# Patient Record
Sex: Male | Born: 1995 | Race: White | Hispanic: No | Marital: Single | Smoking: Current every day smoker
Health system: Southern US, Community
[De-identification: ages and names within clinical notes are randomized; demographics above are authoritative.]

## PROBLEM LIST (undated history)

## (undated) DIAGNOSIS — F909 Attention-deficit hyperactivity disorder, unspecified type: Secondary | ICD-10-CM

## (undated) HISTORY — PX: TONSILLECTOMY: SUR1361

---

## 2018-09-12 ENCOUNTER — Emergency Department (HOSPITAL_BASED_OUTPATIENT_CLINIC_OR_DEPARTMENT_OTHER)
Admission: EM | Admit: 2018-09-12 | Discharge: 2018-09-12 | Disposition: A | Discharge status: Home / Self Care | Payer: BLUE CROSS/BLUE SHIELD | Attending: Emergency Medicine | Admitting: Emergency Medicine

## 2018-09-12 ENCOUNTER — Emergency Department (HOSPITAL_BASED_OUTPATIENT_CLINIC_OR_DEPARTMENT_OTHER): Payer: BLUE CROSS/BLUE SHIELD

## 2018-09-12 ENCOUNTER — Encounter (HOSPITAL_BASED_OUTPATIENT_CLINIC_OR_DEPARTMENT_OTHER): Payer: Self-pay | Admitting: Emergency Medicine

## 2018-09-12 ENCOUNTER — Other Ambulatory Visit: Payer: Self-pay

## 2018-09-12 DIAGNOSIS — Y999 Unspecified external cause status: Secondary | ICD-10-CM | POA: Insufficient documentation

## 2018-09-12 DIAGNOSIS — S62346A Nondisplaced fracture of base of fifth metacarpal bone, right hand, initial encounter for closed fracture: Secondary | ICD-10-CM | POA: Diagnosis not present

## 2018-09-12 DIAGNOSIS — X58XXXA Exposure to other specified factors, initial encounter: Secondary | ICD-10-CM | POA: Diagnosis not present

## 2018-09-12 DIAGNOSIS — Y929 Unspecified place or not applicable: Secondary | ICD-10-CM | POA: Diagnosis not present

## 2018-09-12 DIAGNOSIS — F909 Attention-deficit hyperactivity disorder, unspecified type: Secondary | ICD-10-CM | POA: Insufficient documentation

## 2018-09-12 DIAGNOSIS — S62339A Displaced fracture of neck of unspecified metacarpal bone, initial encounter for closed fracture: Secondary | ICD-10-CM

## 2018-09-12 DIAGNOSIS — Y9389 Activity, other specified: Secondary | ICD-10-CM | POA: Insufficient documentation

## 2018-09-12 DIAGNOSIS — S6991XA Unspecified injury of right wrist, hand and finger(s), initial encounter: Secondary | ICD-10-CM | POA: Diagnosis present

## 2018-09-12 DIAGNOSIS — F17210 Nicotine dependence, cigarettes, uncomplicated: Secondary | ICD-10-CM | POA: Insufficient documentation

## 2018-09-12 HISTORY — DX: Attention-deficit hyperactivity disorder, unspecified type: F90.9

## 2018-09-12 NOTE — ED Triage Notes (Signed)
Slipped on wet steps last night, falling and injuring right hand.  Swelling to ulnar aspect of hand.

## 2018-09-12 NOTE — ED Provider Notes (Signed)
[facility] EMERGENCY DEPARTMENT Provider Note   CSN: [id number] Arrival date & time: 09/12/18  1005     History   Chief Complaint Chief Complaint  Patient presents with  . Hand Injury    HPI Brent Graham is a 22 y.o. male.  HPI 22 year old male with no pertinent past medical history presents to the ED for evaluation right hand pain.  Patient states that he was trying to jump up to sets of stairs when he fell on wet steps and punched the step with his right hand.  Patient reports pain over his fifth metacarpal.  Patient denies any head injury or LOC.  Denies any weakness.  Reports some intermittent tingling.  Reports pain is worse with palpation or range of motion.  He has not taken anything for the pain prior to arrival.  Denies any other associated injuries from the accident. Past Medical History:  Diagnosis Date  . ADHD     There are no active problems to display for this patient.   Past Surgical History:  Procedure Laterality Date  . TONSILLECTOMY          Home Medications    Prior to Admission medications   Not on File    Family History No family history on file.  Social History Social History   Tobacco Use  . Smoking status: Current Every Day Smoker    Packs/day: 1.00    Types: Cigarettes  . Smokeless tobacco: Former Engineer, water Use Topics  . Alcohol use: Yes    Comment: 2-3 x/week.  Beer  . Drug use: Never     Allergies   Patient has no known allergies.   Review of Systems Review of Systems  Constitutional: Negative for fever.  Musculoskeletal: Positive for arthralgias, joint swelling and myalgias.  Skin: Negative for color change.  Neurological: Negative for weakness, numbness and headaches.  Psychiatric/Behavioral: Negative for sleep disturbance.     Physical Exam Updated Vital Signs BP 106/73 (BP Location: Left Arm)   Pulse 66   Temp 98.3 F (36.8 C) (Oral)   Resp 20   Ht 5\' 9"  (1.753 m)   Wt 93 kg   SpO2  99%   BMI 30.27 kg/m   Physical Exam  Constitutional: He appears well-developed and well-nourished. No distress.  HENT:  Head: Normocephalic and atraumatic.  Eyes: Right eye exhibits no discharge. Left eye exhibits no discharge. No scleral icterus.  Neck: Normal range of motion.  Pulmonary/Chest: No respiratory distress.  Musculoskeletal: Normal range of motion.  Patient does have swelling and pain over the right fifth metacarpal.  There is no obvious deformity noted on my exam.  He does have good flexion of the right fifth metacarpal but limited extension.  Patient has good capillary refill with normal sensation in all digits.  Radial pulses are 2+ bilaterally.  Skin compartments are soft.  He has no scaphoid tenderness.  There is no open wound.  Full range of motion of the right wrist right elbow without pain.  Good opposition of the thumb.   Neurological: He is alert.  Skin: Skin is warm and dry. Capillary refill takes less than 2 seconds. No pallor.  Psychiatric: His behavior is normal. Judgment and thought content normal.  Nursing note and vitals reviewed.    ED Treatments / Results  Labs (all labs ordered are listed, but only abnormal results are displayed) Labs Reviewed - No data to display  EKG None  Radiology Dg Hand Complete Right  Result Date: 09/12/2018 CLINICAL DATA:  Pt c/o right pinky finger pain, bruising, swelling and tenderness since falling on it last night EXAM: RIGHT HAND - COMPLETE 3+ VIEW COMPARISON:  None. FINDINGS: Boxer's fracture of the distal fifth metacarpal, distracted less than 2 mm, with minimal palmar angulation of the distal fracture fragment. No displacement. No definite intra-articular involvement. No other bone abnormality identified. Regional soft tissues unremarkable. IMPRESSION: 1. Minimally displaced boxer's fracture, distal fifth metacarpal. Electronically Signed   By: Corlis Leak M.D.   On: 09/12/2018 10:48    Procedures Procedures  (including critical care time)  Medications Ordered in ED Medications - No data to display   Initial Impression / Assessment and Plan / ED Course  I have reviewed the triage vital signs and the nursing notes.  Pertinent labs & imaging results that were available during my care of the patient were reviewed by me and considered in my medical decision making (see chart for details).     Patient presents the ED for evaluation of right hand pain.  X-rays do show a boxer's fracture.  There is minimal angulation displacement.  No open fracture noted.  Neurovascularly intact.  Ulnar gutter splint was placed.  We will give outpatient Ortho follow-up.  Discussed reasons to return the ED immediately.  Pt is hemodynamically stable, in NAD, & able to ambulate in the ED. Evaluation does not show pathology that would require ongoing emergent intervention or inpatient treatment. I explained the diagnosis to the patient. Pain has been managed & has no complaints prior to dc. Pt is comfortable with above plan and is stable for discharge at this time. All questions were answered prior to disposition. Strict return precautions for f/u to the ED were discussed. Encouraged follow up with PCP.    Final Clinical Impressions(s) / ED Diagnoses   Final diagnoses:  Closed boxer's fracture, initial encounter    ED Discharge Orders    None       Wallace Keller 09/12/18 1148    Little, Ambrose Finland, MD 09/13/18 [phone]

## 2018-09-12 NOTE — Discharge Instructions (Addendum)
Motrin and tylenonl as needed for pain. Ice affected area (see instructions below).  Please call the orthopedic physician listed today or first thing in the morning to schedule a follow up appointment.   Fractures generally take 4-6 weeks to heal. It is very important to keep your splint dry until your follow up with the orthopedic doctor and a cast can be applied. You may place a plastic bag around the extremity with the splint while bathing to keep it dry. Also try to sleep with the extremity elevated for the next several nights to decrease swelling. Check the fingertips and toes several times per day to make sure they are not cold, pale, or blue. If this is the case, the splint may be too tight and should return to the ER, your regular doctor or the orthopedist for recheck. Return to the ER for new or worsening symptoms, any additional concerns.   COLD THERAPY DIRECTIONS:  Ice or gel packs can be used to reduce both pain and swelling. Ice is the most helpful within the first 24 to 48 hours after an injury or flareup from overusing a muscle or joint.  Ice is effective, has very few side effects, and is safe for most people to use.   If you expose your skin to cold temperatures for too long or without the proper protection, you can damage your skin or nerves. Watch for signs of skin damage due to cold.   HOME CARE INSTRUCTIONS  Follow these tips to use ice and cold packs safely.  Place a dry or damp towel between the ice and skin. A damp towel will cool the skin more quickly, so you may need to shorten the time that the ice is used.  For a more rapid response, add gentle compression to the ice.  Ice for no more than 10 to 20 minutes at a time. The bonier the area you are icing, the less time it will take to get the benefits of ice.  Check your skin after 5 minutes to make sure there are no signs of a poor response to cold or skin damage.  Rest 20 minutes or more in between uses.  Once your skin is  numb, you can end your treatment. You can test numbness by very lightly touching your skin. The touch should be so light that you do not see the skin dimple from the pressure of your fingertip. When using ice, most people will feel these normal sensations in this order: cold, burning, aching, and numbness.

## 2019-10-04 IMAGING — CR DG HAND COMPLETE 3+V*R*
3 series · 3 of 3 positions shown · non-contrast
Comparison: None.

CLINICAL DATA: Pt c/o right pinky finger pain, bruising, swelling
and tenderness since falling on it last night

EXAM:
RIGHT HAND - COMPLETE 3+ VIEW

[x hand pa right]
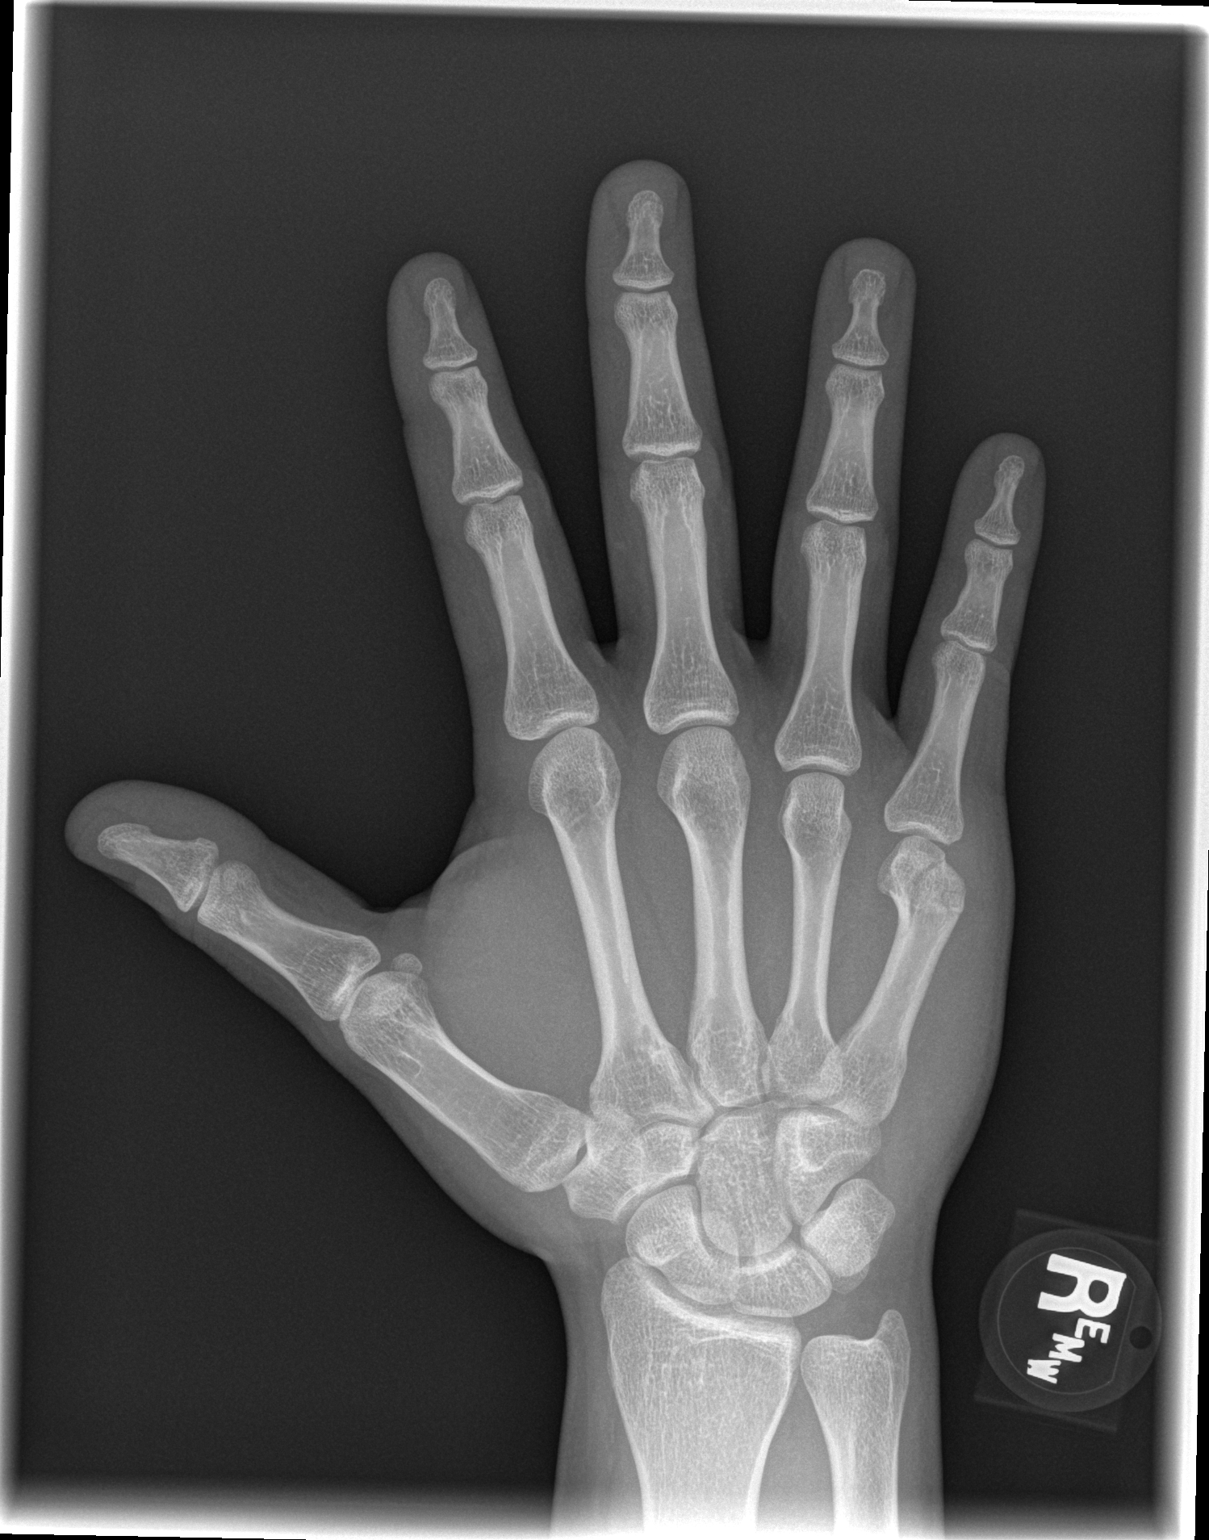

[x hand oblique right]
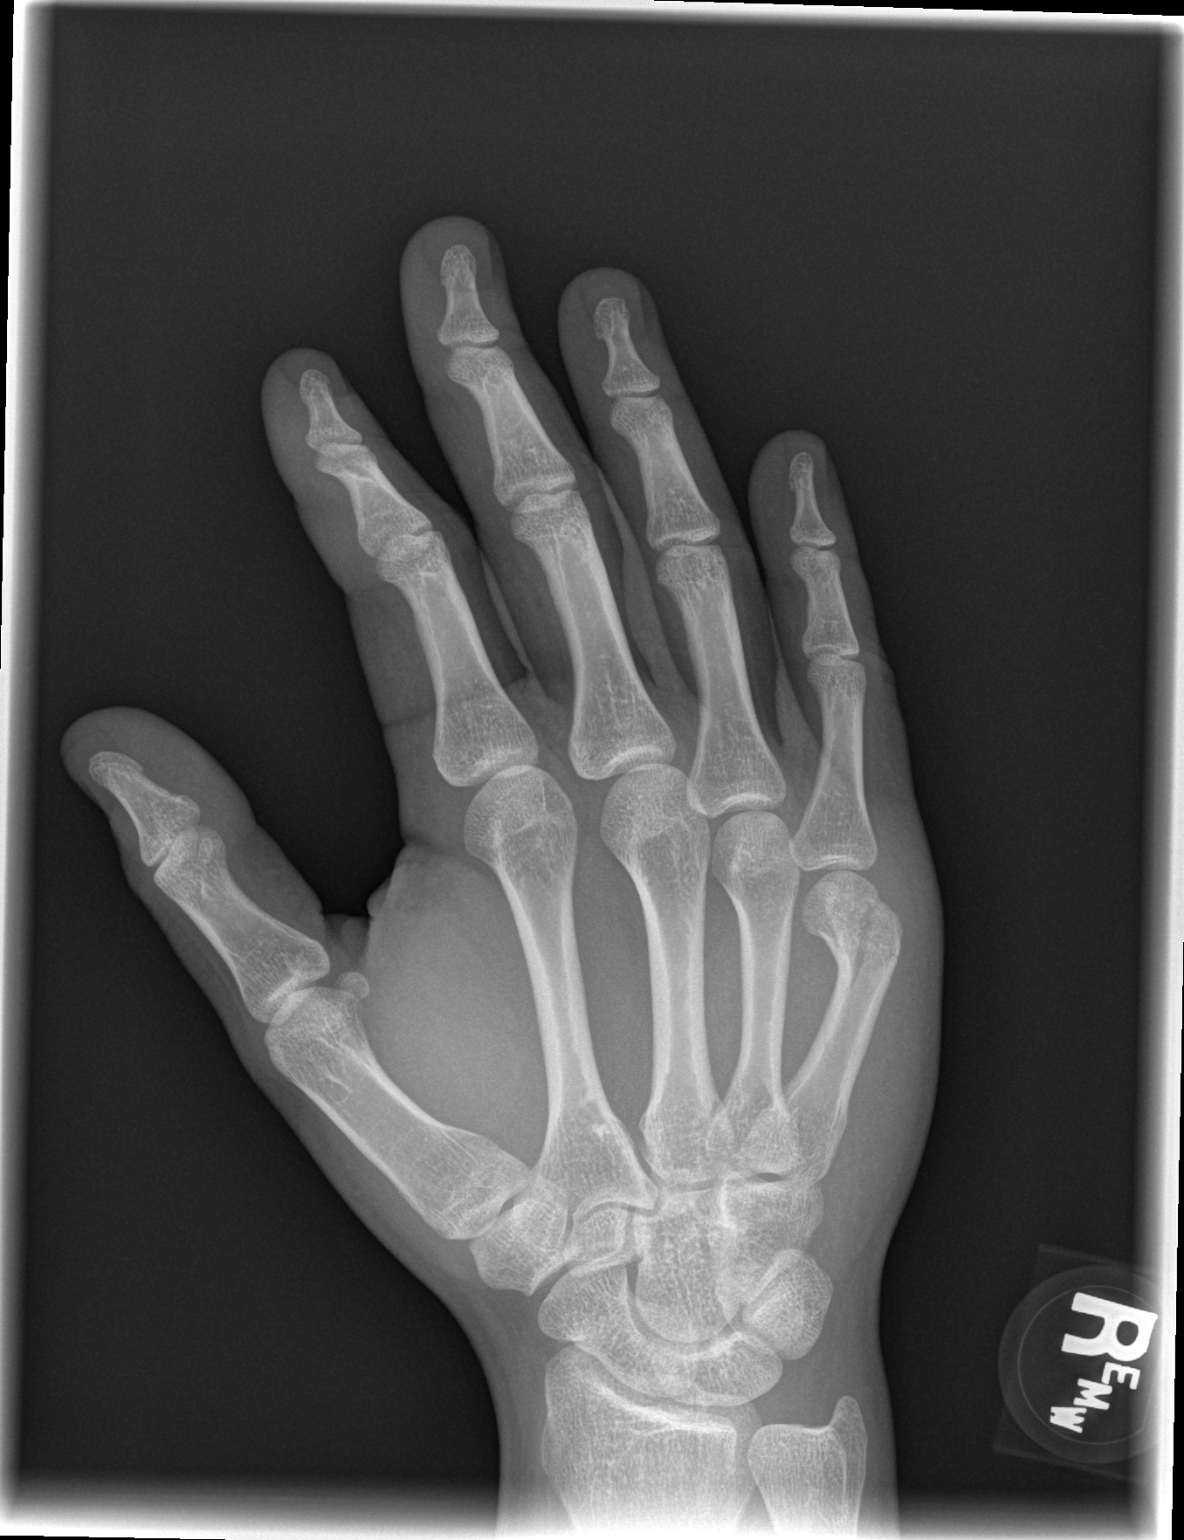

[x hand lat right]
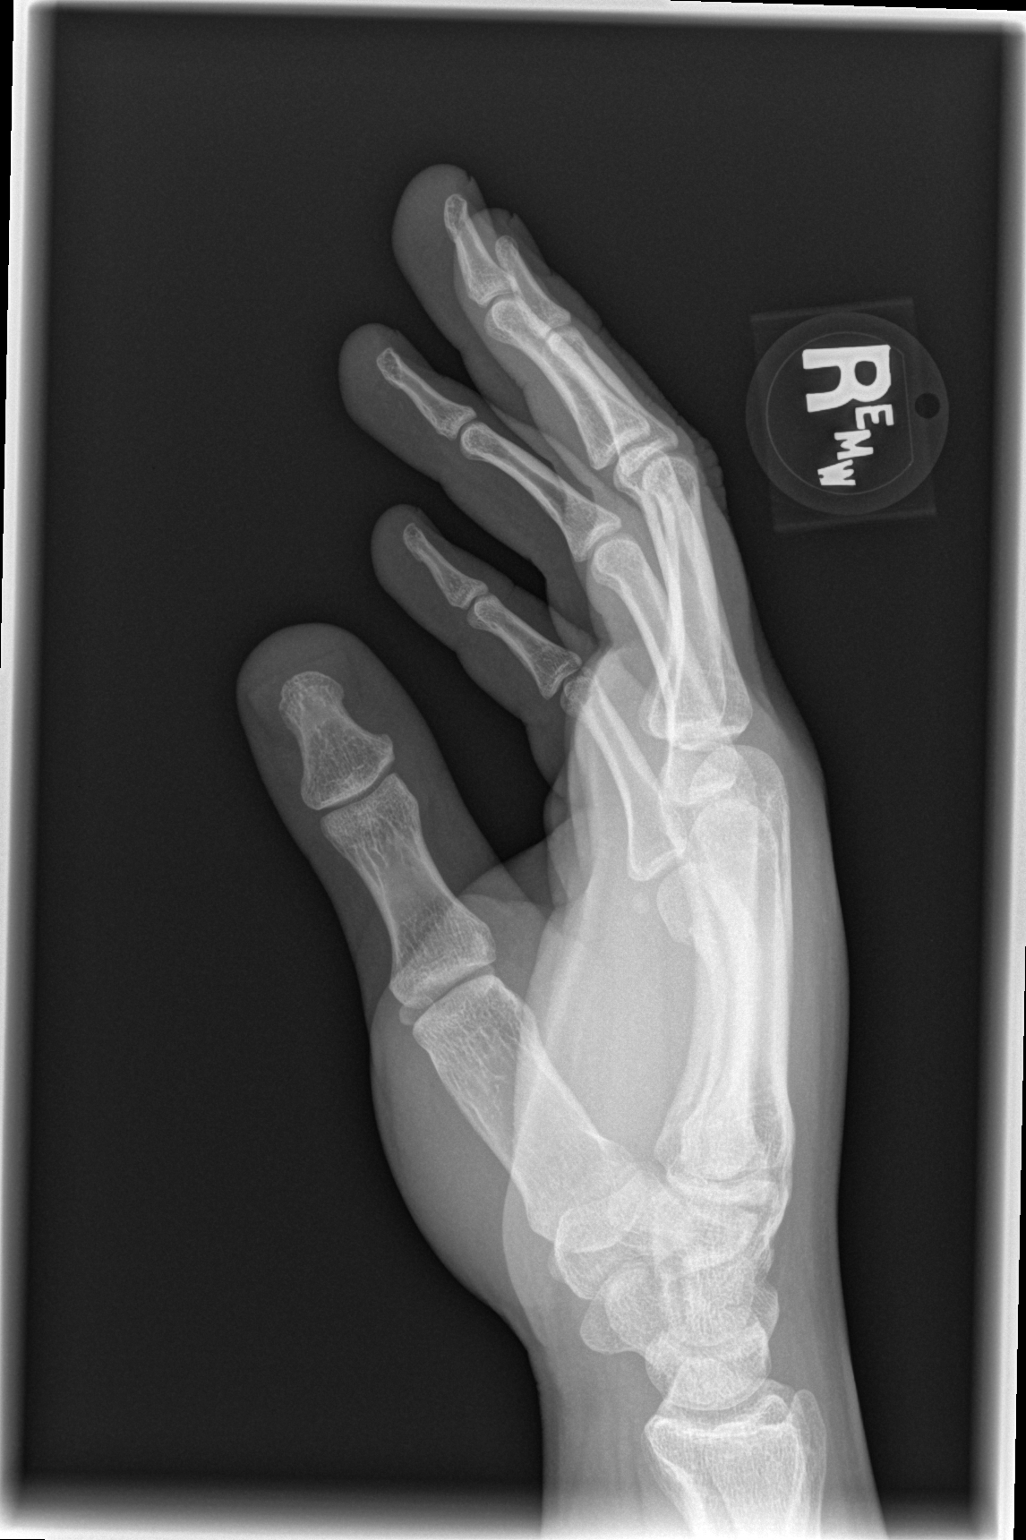

[3 of 3 positions shown; findings below may reference images not displayed]

FINDINGS: Boxer's fracture of the distal fifth metacarpal, distracted less
than 2 mm, with minimal palmar angulation of the distal fracture
fragment. No displacement. No definite intra-articular involvement.

No other bone abnormality identified. Regional soft tissues
unremarkable.
IMPRESSION: 1. Minimally displaced boxer's fracture, distal fifth metacarpal.
